# Patient Record
Sex: Male | Born: 1985 | Race: White | Hispanic: Yes | Marital: Married | Smoking: Never smoker
Health system: Southern US, Community
[De-identification: ages and names within clinical notes are randomized; demographics above are authoritative.]

## PROBLEM LIST (undated history)

## (undated) DIAGNOSIS — E7800 Pure hypercholesterolemia, unspecified: Secondary | ICD-10-CM

## (undated) DIAGNOSIS — I639 Cerebral infarction, unspecified: Secondary | ICD-10-CM

---

## 2017-02-23 ENCOUNTER — Encounter (HOSPITAL_BASED_OUTPATIENT_CLINIC_OR_DEPARTMENT_OTHER): Payer: Self-pay | Admitting: Emergency Medicine

## 2017-02-23 ENCOUNTER — Emergency Department (HOSPITAL_BASED_OUTPATIENT_CLINIC_OR_DEPARTMENT_OTHER): Payer: Self-pay

## 2017-02-23 ENCOUNTER — Emergency Department (HOSPITAL_BASED_OUTPATIENT_CLINIC_OR_DEPARTMENT_OTHER)
Admission: EM | Admit: 2017-02-23 | Discharge: 2017-02-23 | Disposition: A | Discharge status: Home / Self Care | Payer: Self-pay | Attending: Emergency Medicine | Admitting: Emergency Medicine

## 2017-02-23 DIAGNOSIS — R519 Headache, unspecified: Secondary | ICD-10-CM

## 2017-02-23 DIAGNOSIS — R51 Headache: Secondary | ICD-10-CM | POA: Insufficient documentation

## 2017-02-23 HISTORY — DX: Pure hypercholesterolemia, unspecified: E78.00

## 2017-02-23 HISTORY — DX: Cerebral infarction, unspecified: I63.9

## 2017-02-23 MED ORDER — DIPHENHYDRAMINE HCL 50 MG/ML IJ SOLN
25.0000 mg | Freq: Once | INTRAMUSCULAR | Status: AC
  Administered 2017-02-23: 25 mg via INTRAVENOUS
  Filled 2017-02-23: qty 1

## 2017-02-23 MED ORDER — METOCLOPRAMIDE HCL 5 MG/ML IJ SOLN
10.0000 mg | Freq: Once | INTRAMUSCULAR | Status: AC
  Administered 2017-02-23: 10 mg via INTRAVENOUS
  Filled 2017-02-23: qty 2

## 2017-02-23 MED ORDER — SODIUM CHLORIDE 0.9 % IV BOLUS (SEPSIS)
1000.0000 mL | Freq: Once | INTRAVENOUS | Status: AC
  Administered 2017-02-23: 1000 mL via INTRAVENOUS

## 2017-02-23 NOTE — Discharge Instructions (Signed)
Please read and follow all provided instructions.  Your diagnoses today include:  1. Acute nonintractable headache, unspecified headache type     Tests performed today include: CT of your head which was normal and did not show any serious cause of your headache Vital signs. See below for your results today.   Medications:  In the Emergency Department you received: Reglan - antinausea/headache medication Benadryl - antihistamine to counteract potential side effects of reglan Toradol - NSAID medication similar to ibuprofen  Take any prescribed medications only as directed.  Additional information:  Follow any educational materials contained in this packet.  You are having a headache. No specific cause was found today for your headache. It may have been a migraine or other cause of headache. Stress, anxiety, fatigue, and depression are common triggers for headaches.   Your headache today does not appear to be life-threatening or require hospitalization, but often the exact cause of headaches is not determined in the emergency department. Therefore, follow-up with your doctor is very important to find out what may have caused your headache and whether or not you need any further diagnostic testing or treatment.   Sometimes headaches can appear benign (not harmful), but then more serious symptoms can develop which should prompt an immediate re-evaluation by your doctor or the emergency department.  BE VERY CAREFUL not to take multiple medicines containing Tylenol (also called acetaminophen). Doing so can lead to an overdose which can damage your liver and cause liver failure and possibly death.   Follow-up instructions: Please follow-up with your primary care provider in the next 3 days for further evaluation of your symptoms.   Return instructions:  Please return to the Emergency Department if you experience worsening symptoms. Return if the medications do not resolve your headache, if  it recurs, or if you have multiple episodes of vomiting or cannot keep down fluids. Return if you have a change from the usual headache. RETURN IMMEDIATELY IF you: Develop a sudden, severe headache Develop confusion or become poorly responsive or faint Develop a fever above 100.61F or problem breathing Have a change in speech, vision, swallowing, or understanding Develop new weakness, numbness, tingling, incoordination in your arms or legs Have a seizure Please return if you have any other emergent concerns.  Additional Information:  Your vital signs today were: BP 125/81 (BP Location: Left Arm)    Pulse 64    Temp 98.1 F (36.7 C) (Oral)    Resp 16    Wt 60.3 kg (133 lb)    SpO2 100%  If your blood pressure (BP) was elevated above 135/85 this visit, please have this repeated by your doctor within one month. --------------

## 2017-02-23 NOTE — ED Provider Notes (Signed)
[facility] Note   CSN: [id number] Arrival date & time: 02/23/17  1132     History   Chief Complaint Chief Complaint  Patient presents with  . Numbness    HPI [place] is a 31 y.o. male.  HPI  31 y.o. male presents to the Emergency Department today due to numbness to right side of face since Friday. Intermittent headache. States numbness has been present constantly since Friday without change. Pt denies any trauma to area. No visual changes. No N/V. Pt ntoes hx stroke last year that requires ED visit due to headache and blurred vision. Pt states this was under a different name in [place]. The name was [employer] Review of chart showed complex migraine as diagnosis. MRI was ordered, which was negative. Neuro was consulted as well during ED visit. Pt states he used that name for insurance reasons. Currently notes mild headache on right side. No BUE/BLE numbness/weakness. No CP/SOB/ABD pain. No fevers. No URI symptoms. No other symptoms noted.     Past Medical History:  Diagnosis Date  . High cholesterol   . Stroke [place])     There are no active problems to display for this patient.   History reviewed. No pertinent surgical history.     Home Medications    Prior to Admission medications   Not on File    Family History No family history on file.  Social History Social History  Substance Use Topics  . Smoking status: Never Smoker  . Smokeless tobacco: Never Used  . Alcohol use No     Allergies   Patient has no known allergies.   Review of Systems Review of Systems ROS reviewed and all are negative for acute change except as noted in the HPI.  Physical Exam Updated Vital Signs BP 125/81 (BP Location: Left Arm)   Pulse 64   Temp 98.1 F (36.7 C) (Oral)   Resp 16   Wt 60.3 kg (133 lb)   SpO2 100%   Physical Exam  Constitutional: He is oriented to person, place, and time. Vital signs are normal. He appears  well-developed and well-nourished. No distress.  HENT:  Head: Normocephalic and atraumatic.  Right Ear: Hearing, tympanic membrane, external ear and ear canal normal.  Left Ear: Hearing, tympanic membrane, external ear and ear canal normal.  Nose: Nose normal.  Mouth/Throat: Uvula is midline, oropharynx is clear and moist and mucous membranes are normal. No trismus in the jaw. No oropharyngeal exudate, posterior oropharyngeal erythema or tonsillar abscesses.  Eyes: Pupils are equal, round, and reactive to light. Conjunctivae and EOM are normal.  Neck: Normal range of motion. Neck supple. No tracheal deviation present.  Cardiovascular: Normal rate, regular rhythm, S1 normal, S2 normal, normal heart sounds, intact distal pulses and normal pulses.   Pulmonary/Chest: Effort normal and breath sounds normal. No respiratory distress. He has no decreased breath sounds. He has no wheezes. He has no rhonchi. He has no rales.  Abdominal: Normal appearance and bowel sounds are normal. There is no tenderness.  Musculoskeletal: Normal range of motion.  Neurological: He is alert and oriented to person, place, and time. He has normal strength. No cranial nerve deficit or sensory deficit.  Cranial Nerves:  II: Pupils equal, round, reactive to light III,IV, VI: ptosis not present, extra-ocular motions intact bilaterally  V,VII: smile symmetric, facial light touch sensation equal VIII: hearing grossly normal bilaterally  IX,X: midline uvula rise  XI: bilateral shoulder shrug equal and strong XII: midline  tongue extension Negative pronator drift Finger to nose exam unremarkable Equal sensation bilateral face  Skin: Skin is warm and dry.  Psychiatric: He has a normal mood and affect. His speech is normal and behavior is normal. Thought content normal.     ED Treatments / Results  Labs (all labs ordered are listed, but only abnormal results are displayed) Labs Reviewed - No data to display  EKG  EKG  Interpretation None       Radiology Ct Head Wo Contrast  Result Date: 02/23/2017 CLINICAL DATA:  Right facial numbness. Pain in the hand. Stroke 1 year ago. Headache. EXAM: CT HEAD WITHOUT CONTRAST TECHNIQUE: Contiguous axial images were obtained from the base of the skull through the vertex without intravenous contrast. COMPARISON:  None FINDINGS: Brain: The brainstem, cerebellum, cerebral peduncles, thalami, basal ganglia, basilar cisterns, and ventricular system appear within normal limits. No intracranial hemorrhage, mass lesion, or acute CVA. Vascular: Unremarkable Skull: Unremarkable Sinuses/Orbits: Unremarkable Other: No supplemental non-categorized findings. IMPRESSION: 1.  No significant abnormality identified. Electronically Signed   By: Gaylyn RongWalter  Liebkemann M.D.   On: 02/23/2017 12:54    Procedures Procedures (including critical care time)  Medications Ordered in ED Medications - No data to display   Initial Impression / Assessment and Plan / ED Course  I have reviewed the triage vital signs and the nursing notes.  Pertinent labs & imaging results that were available during my care of the patient were reviewed by me and considered in my medical decision making (see chart for details).  Final Clinical Impressions(s) / ED Diagnoses   {I have reviewed and evaluated the relevant imaging studies.  {I have reviewed the relevant previous healthcare records.  {I obtained HPI from historian. {Patient discussed with supervising physician.  ED Course:  Assessment: Pt is a 31 y.o. male presents to the Emergency Department today due to numbness to right side of face since Friday. Intermittent headache. States numbness has been present constantly since Friday without change. Pt denies any trauma to area. No visual changes. No N/V. Pt ntoes hx stroke last year that requires ED visit due to headache and blurred vision. Pt states this was under a different name in [place]. The name was [employer]  [place]. Review of chart showed complex migraine as diagnosis. MRI was ordered, which was negative. Neuro was consulted as well during ED visit. Pt states he used that name for insurance reasons. Currently notes mild headache on right side. No BUE/BLE numbness/weakness. No CP/SOB/ABD pain. No fevers. No URI symptoms. . On exam, pt in NAD. Nontoxic/nonseptic appearing. VSS. Afebrile. Lungs CTA. Heart RRR. Abdomen nontender soft. Unremarkable neuro exam. CT head unremarkable. Given migraine cocktail with relief in ED. Discussed with attending. Likely complex migraine. Given Neuro referral. Plan is to DC home with follow up. Return precautions given.. At time of discharge, Patient is in no acute distress. Vital Signs are stable. Patient is able to ambulate. Patient able to tolerate PO.   Disposition/Plan:  DC Home Additional Verbal discharge instructions given and discussed with patient.  Pt Instructed to f/u with neuro in the next week for evaluation and treatment of symptoms. Return precautions given Pt acknowledges and agrees with plan  Supervising Physician Lavera GuiseLiu, Dana Duo, MD  Final diagnoses:  Acute nonintractable headache, unspecified headache type    New Prescriptions New Prescriptions   No medications on file     Audry PiliMohr, Margurite Duffy, Cordelia Poche-C 02/23/17 1325    Lavera GuiseLiu, Dana Duo, MD 02/23/17 [phone]

## 2017-02-23 NOTE — ED Notes (Signed)
Pt still reports decreased sensation on the R side of his face.

## 2017-02-23 NOTE — ED Triage Notes (Signed)
Numbness to R side of face and head with pain to touch since Friday. Denies weakness to arms or legs. Pt with normal speech, grips equal.

## 2017-02-23 NOTE — ED Notes (Signed)
Patient transported to CT 

## 2018-07-25 IMAGING — CT CT HEAD W/O CM
3 series · 15 of 47 positions shown, 18 images · non-contrast
Comparison: None

CLINICAL DATA: Right facial numbness. Pain in the hand. Stroke 1
year ago. Headache.

EXAM:
CT HEAD WITHOUT CONTRAST
TECHNIQUE: Contiguous axial images were obtained from the base of the skull
through the vertex without intravenous contrast.

[Series 2: head wo · axial · 0.39mm/px · z∈[-162,-32]mm · 9 of 32 slices shown, 12 images]
[im 3/32  brain]
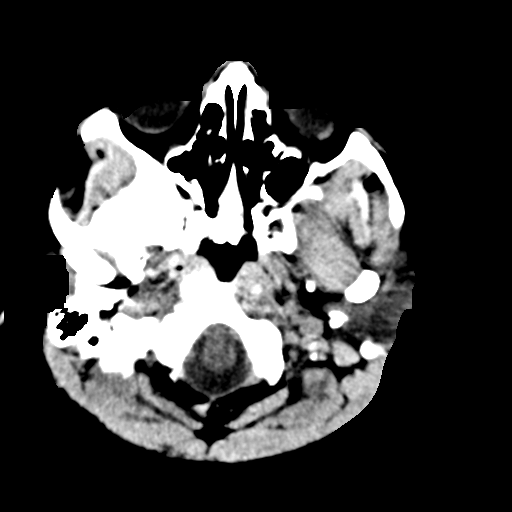
[im 3/32  bone]
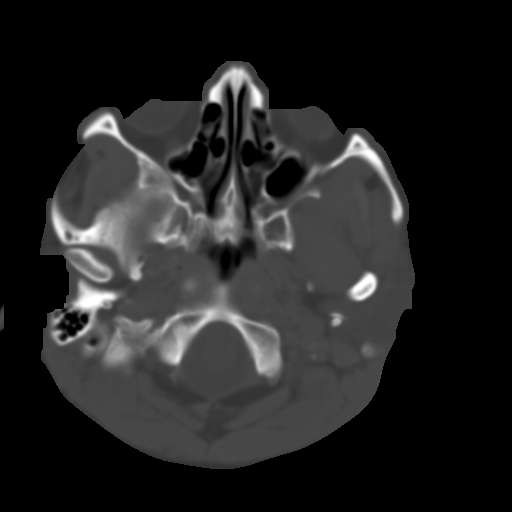
[im 6/32  brain]
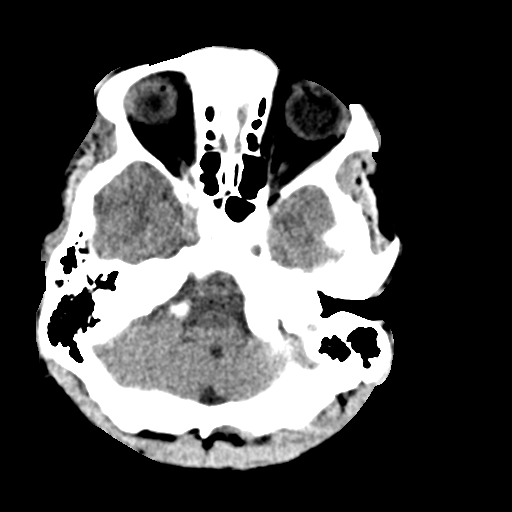
[im 9/32  brain]
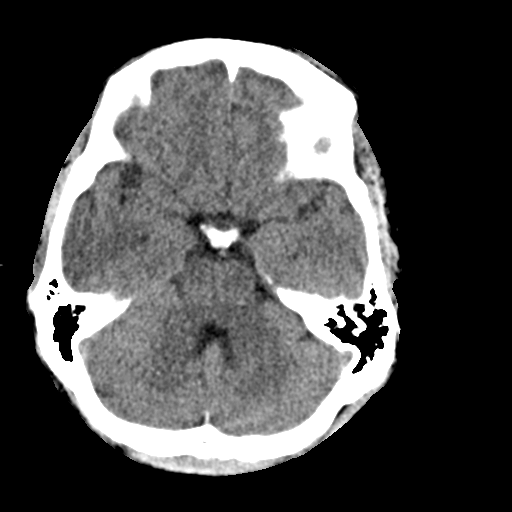
[im 12/32  brain]
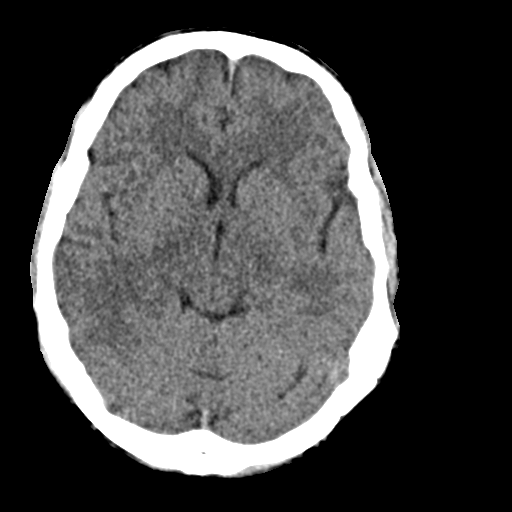
[im 17/32  brain]
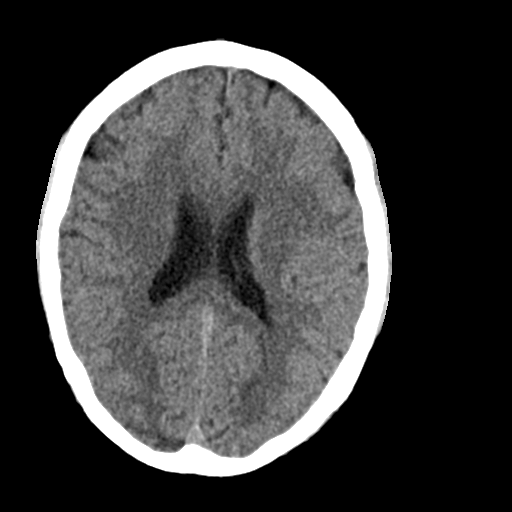
[im 17/32  bone]
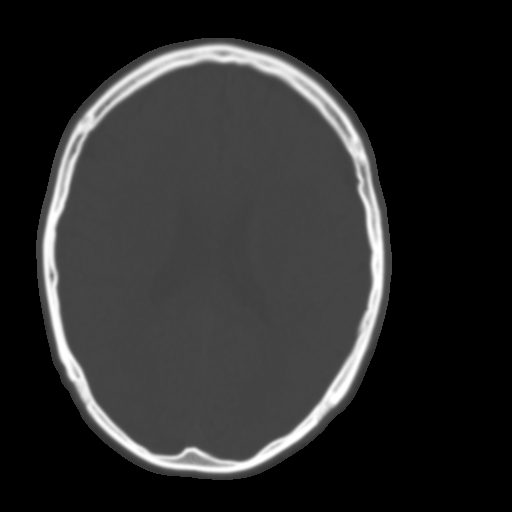
[im 20/32  brain]
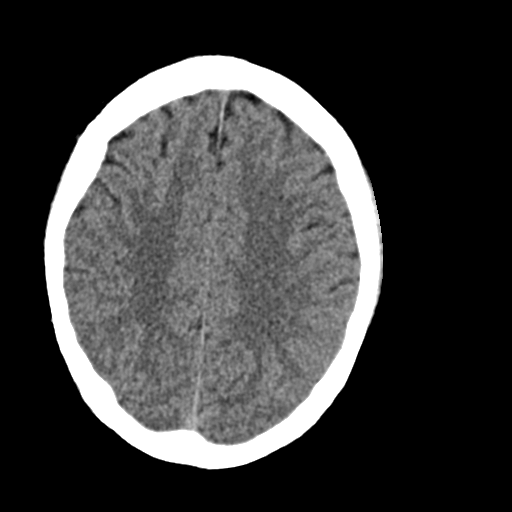
[im 23/32  brain]
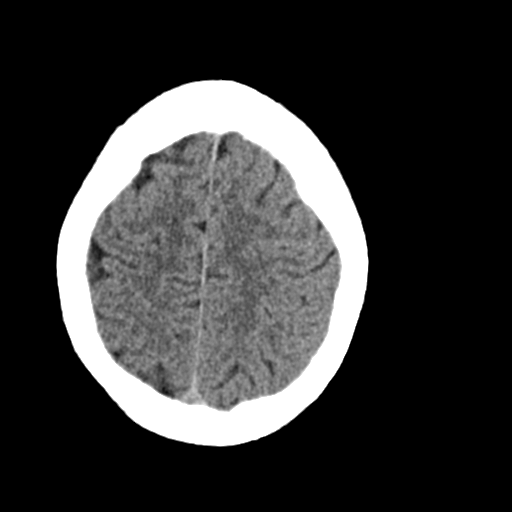
[im 26/32  brain]
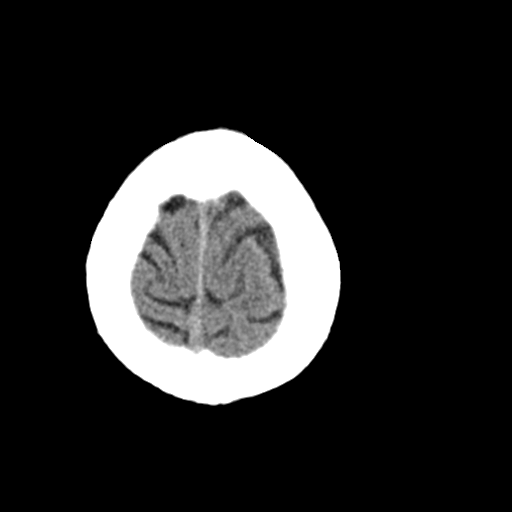
[im 29/32  brain]
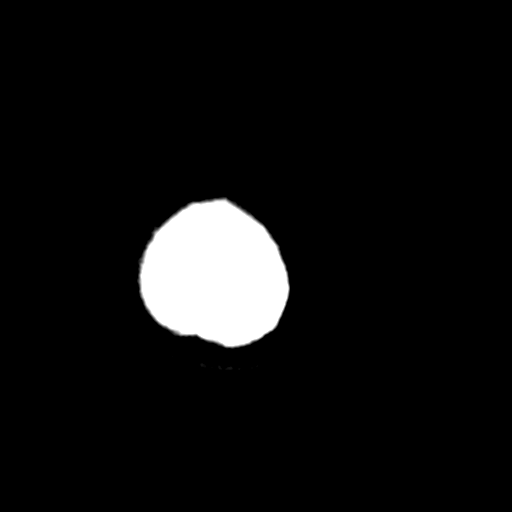
[im 29/32  bone]
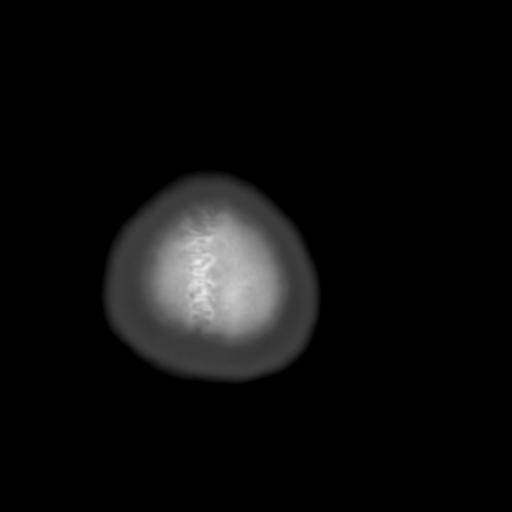

[Series 4: coronal soft · coronal · 0.31mm/px · 3 of 64 slices shown]
[im 22/64  brain]
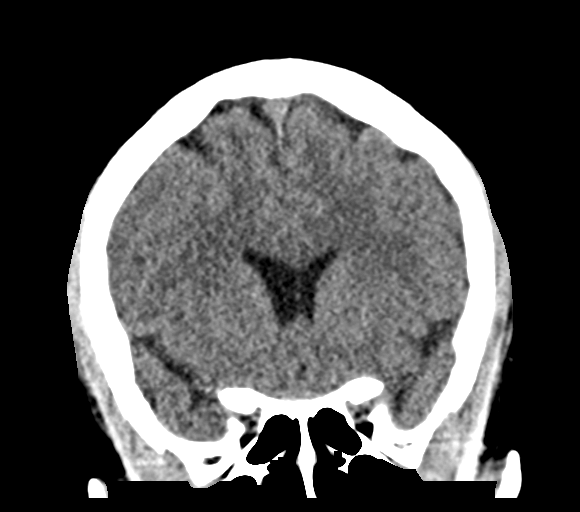
[im 29/64  brain]
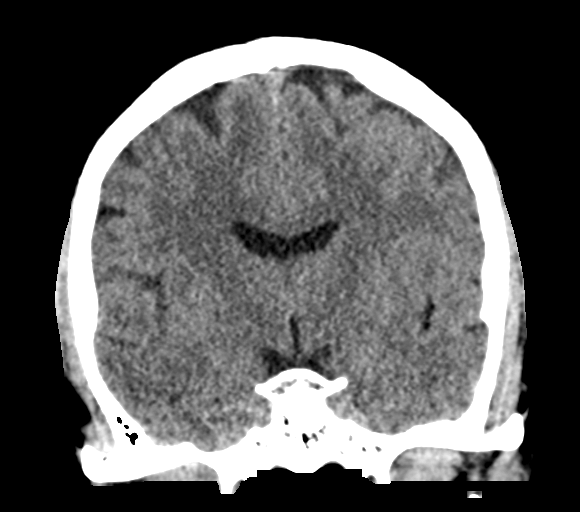
[im 36/64  brain]
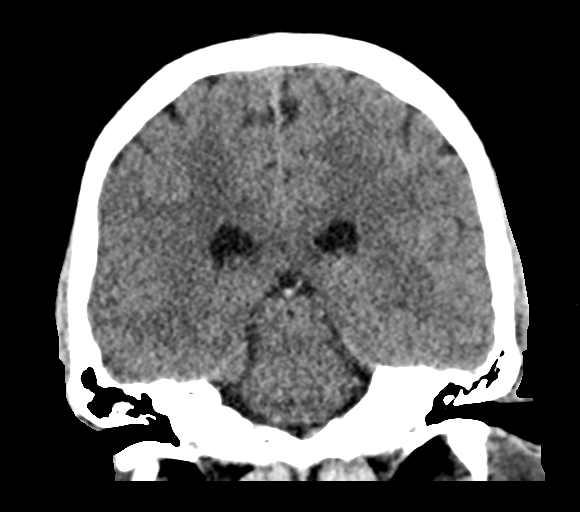

[Series 5: sag soft · sagittal · 0.31mm/px · 3 of 55 slices shown]
[im 19/55  brain]
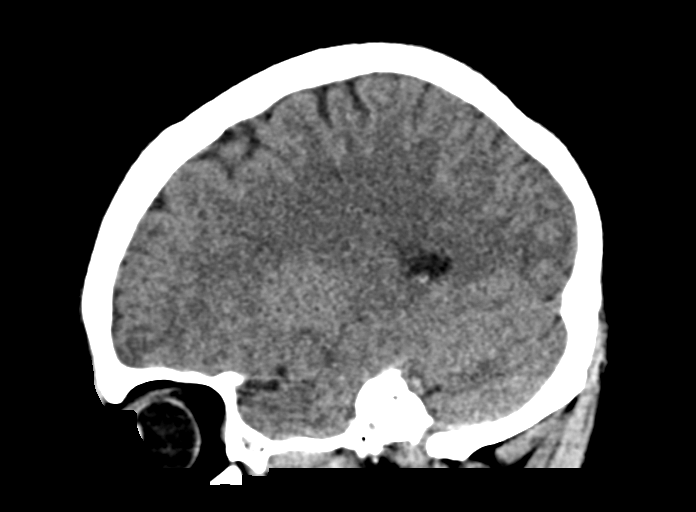
[im 28/55  brain]
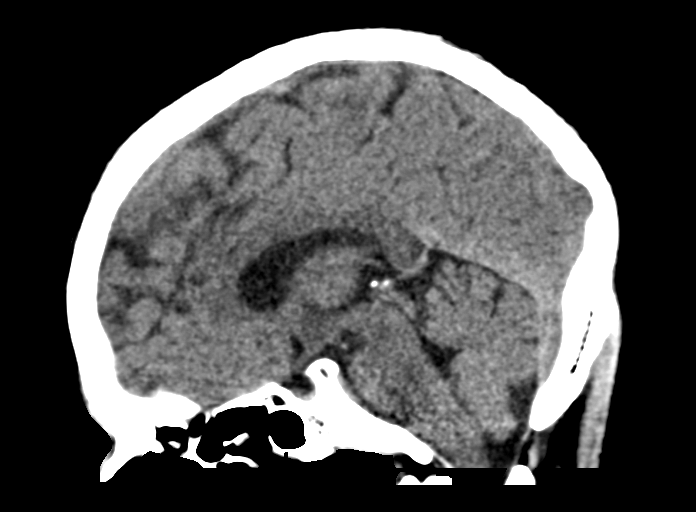
[im 37/55  brain]
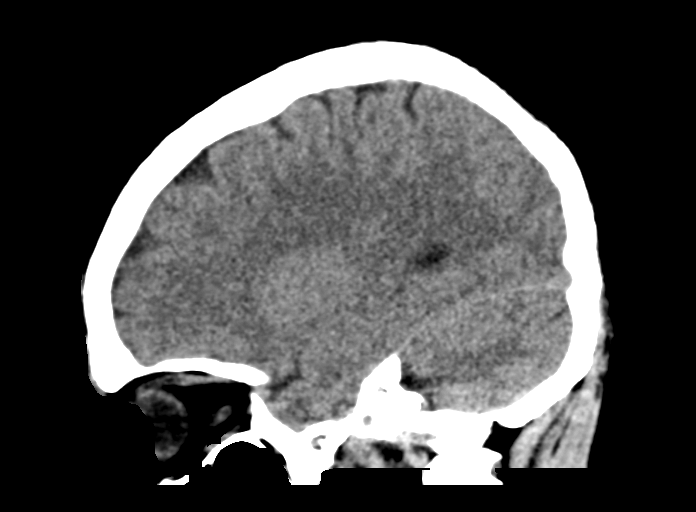

[15 of 47 positions shown; findings below may reference images not displayed]

FINDINGS: Brain: The brainstem, cerebellum, cerebral peduncles, thalami, basal
ganglia, basilar cisterns, and ventricular system appear within
normal limits. No intracranial hemorrhage, mass lesion, or acute
CVA.

Vascular: Unremarkable

Skull: Unremarkable

Sinuses/Orbits: Unremarkable

Other: No supplemental non-categorized findings.
IMPRESSION: 1.  No significant abnormality identified.
